# Patient Record
Sex: Male | Born: 1965 | Race: White | Hispanic: No | Marital: Married | Smoking: Current every day smoker
Health system: Southern US, Community
[De-identification: ages and names within clinical notes are randomized; demographics above are authoritative.]

## PROBLEM LIST (undated history)

## (undated) DIAGNOSIS — G43909 Migraine, unspecified, not intractable, without status migrainosus: Secondary | ICD-10-CM

---

## 2005-04-25 ENCOUNTER — Emergency Department: Payer: Self-pay | Admitting: Unknown Physician Specialty

## 2006-04-10 ENCOUNTER — Emergency Department: Payer: Self-pay | Admitting: Emergency Medicine

## 2006-04-10 ENCOUNTER — Other Ambulatory Visit: Payer: Self-pay

## 2006-12-11 ENCOUNTER — Emergency Department: Payer: Self-pay | Admitting: Emergency Medicine

## 2007-06-20 ENCOUNTER — Emergency Department: Payer: Self-pay | Admitting: Emergency Medicine

## 2007-06-20 ENCOUNTER — Other Ambulatory Visit: Payer: Self-pay

## 2009-06-16 ENCOUNTER — Emergency Department: Payer: Self-pay | Admitting: Emergency Medicine

## 2011-04-17 ENCOUNTER — Emergency Department: Payer: Self-pay | Admitting: Emergency Medicine

## 2011-04-17 LAB — CBC
HGB: 15.8 g/dL (ref 13.0–18.0)
Platelet: 181 10*3/uL (ref 150–440)
RDW: 13.2 % (ref 11.5–14.5)
WBC: 11.2 10*3/uL — ABNORMAL HIGH (ref 3.8–10.6)

## 2011-04-17 LAB — BASIC METABOLIC PANEL
Calcium, Total: 9.2 mg/dL (ref 8.5–10.1)
Chloride: 99 mmol/L (ref 98–107)
Co2: 26 mmol/L (ref 21–32)
Creatinine: 1.05 mg/dL (ref 0.60–1.30)
EGFR (African American): >60
Glucose: 98 mg/dL (ref 65–99)
Potassium: 3.6 mmol/L (ref 3.5–5.1)
Sodium: 137 mmol/L (ref 136–145)

## 2011-04-17 LAB — TROPONIN I: Troponin-I: <0.02 ng/mL

## 2011-10-07 ENCOUNTER — Emergency Department: Payer: Self-pay | Admitting: *Deleted

## 2012-02-18 ENCOUNTER — Emergency Department: Payer: Self-pay | Admitting: Emergency Medicine

## 2012-02-18 LAB — COMPREHENSIVE METABOLIC PANEL
Albumin: 3.9 g/dL (ref 3.4–5.0)
Alkaline Phosphatase: 98 U/L (ref 50–136)
BUN: 15 mg/dL (ref 7–18)
Creatinine: 0.86 mg/dL (ref 0.60–1.30)
EGFR (African American): >60
Glucose: 89 mg/dL (ref 65–99)
Potassium: 4.3 mmol/L (ref 3.5–5.1)
SGOT(AST): 23 U/L (ref 15–37)
SGPT (ALT): 24 U/L (ref 12–78)
Total Protein: 7.5 g/dL (ref 6.4–8.2)

## 2012-02-18 LAB — CBC
HCT: 42.1 % (ref 40.0–52.0)
HGB: 14.7 g/dL (ref 13.0–18.0)
MCHC: 35 g/dL (ref 32.0–36.0)
MCV: 94 fL (ref 80–100)
RBC: 4.47 10*6/uL (ref 4.40–5.90)
WBC: 11.1 10*3/uL — ABNORMAL HIGH (ref 3.8–10.6)

## 2012-02-18 LAB — TROPONIN I: Troponin-I: <0.02 ng/mL

## 2012-02-18 LAB — URINALYSIS, COMPLETE
Bilirubin,UR: NEGATIVE
Blood: NEGATIVE
Hyaline Cast: 1
Ketone: NEGATIVE
Nitrite: NEGATIVE
Ph: 5 (ref 4.5–8.0)
Protein: NEGATIVE
Specific Gravity: 1.021 (ref 1.003–1.030)
WBC UR: 1 /HPF (ref 0–5)

## 2012-11-15 ENCOUNTER — Emergency Department: Payer: Self-pay | Admitting: Emergency Medicine

## 2012-12-23 ENCOUNTER — Emergency Department: Payer: Self-pay | Admitting: Emergency Medicine

## 2013-11-07 ENCOUNTER — Emergency Department: Payer: Self-pay | Admitting: Emergency Medicine

## 2013-11-25 ENCOUNTER — Emergency Department: Payer: Self-pay | Admitting: Internal Medicine

## 2013-11-25 LAB — TROPONIN I

## 2013-11-25 LAB — BASIC METABOLIC PANEL
Anion Gap: 4 — ABNORMAL LOW (ref 7–16)
BUN: 13 mg/dL (ref 7–18)
CALCIUM: 8.9 mg/dL (ref 8.5–10.1)
Chloride: 101 mmol/L (ref 98–107)
Co2: 27 mmol/L (ref 21–32)
Creatinine: 1.12 mg/dL (ref 0.60–1.30)
EGFR (African American): >60
GLUCOSE: 82 mg/dL (ref 65–99)
OSMOLALITY: 264 (ref 275–301)
POTASSIUM: 3.9 mmol/L (ref 3.5–5.1)
Sodium: 132 mmol/L — ABNORMAL LOW (ref 136–145)

## 2013-11-25 LAB — CBC
HCT: 38.9 % — ABNORMAL LOW (ref 40.0–52.0)
HGB: 13.3 g/dL (ref 13.0–18.0)
MCH: 32.1 pg (ref 26.0–34.0)
MCHC: 34.3 g/dL (ref 32.0–36.0)
MCV: 94 fL (ref 80–100)
PLATELETS: 233 10*3/uL (ref 150–440)
RBC: 4.16 10*6/uL — ABNORMAL LOW (ref 4.40–5.90)
RDW: 12.8 % (ref 11.5–14.5)
WBC: 13.4 10*3/uL — ABNORMAL HIGH (ref 3.8–10.6)

## 2013-11-25 LAB — PRO B NATRIURETIC PEPTIDE: B-Type Natriuretic Peptide: 142 pg/mL — ABNORMAL HIGH (ref 0–125)

## 2017-12-21 ENCOUNTER — Emergency Department
Admission: EM | Admit: 2017-12-21 | Discharge: 2017-12-21 | Disposition: A | Discharge status: Home / Self Care | Payer: Self-pay | Attending: Emergency Medicine | Admitting: Emergency Medicine

## 2017-12-21 ENCOUNTER — Other Ambulatory Visit: Payer: Self-pay

## 2017-12-21 ENCOUNTER — Emergency Department: Payer: Self-pay

## 2017-12-21 DIAGNOSIS — F17200 Nicotine dependence, unspecified, uncomplicated: Secondary | ICD-10-CM | POA: Insufficient documentation

## 2017-12-21 DIAGNOSIS — M25561 Pain in right knee: Secondary | ICD-10-CM | POA: Insufficient documentation

## 2017-12-21 HISTORY — DX: Migraine, unspecified, not intractable, without status migrainosus: G43.909

## 2017-12-21 MED ORDER — NABUMETONE 500 MG PO TABS: 500.0000 mg | ORAL_TABLET | Freq: Every day | ORAL | 0 refills | Status: AC

## 2017-12-21 NOTE — ED Provider Notes (Signed)
[facility] Emergency Department Provider Note  ____________________________________________   First MD Initiated Contact with Patient 12/21/17 1108     (approximate)  I have reviewed the triage vital signs and the nursing notes.   HISTORY  Chief Complaint Knee Pain   HPI Douglas Estrada is a 52 y.o. male presents to the ED with complaint of knee pain for approximately 3 days.  Patient states that he feels that his knee is not supportive when he walks.  He has had problems with his knee in the past with same knee.  He is not been seen by an orthopedist in the past.  He has been taking over-the-counter medication infrequently for his knee.  He also currently is wearing a limited knee support.  He denies any recent injury to his knee.  Currently rates his pain as 5/10.   Past Medical History:  Diagnosis Date  . Migraines     There are no active problems to display for this patient.   History reviewed. No pertinent surgical history.  Prior to Admission medications   Medication Sig Start Date End Date Taking? Authorizing Provider  nabumetone (RELAFEN) 500 MG tablet Take 1 tablet (500 mg total) by mouth daily. 12/21/17   Tommi Rumps, PA-C    Allergies Patient has no known allergies.  No family history on file.  Social History Social History   Tobacco Use  . Smoking status: Current Every Day Smoker  Substance Use Topics  . Alcohol use: Yes    Alcohol/week: 2.0 standard drinks    Types: 2 Cans of beer per week  . Drug use: Not on file    Review of Systems Constitutional: No fever/chills Cardiovascular: Denies chest pain. Respiratory: Denies shortness of breath. Musculoskeletal: Positive for right knee pain. Skin: Negative for rash. Neurological: Negative for  focal weakness or numbness. ___________________________________________   PHYSICAL EXAM:  VITAL SIGNS: ED Triage Vitals [12/21/17 1050]  Enc Vitals Group     BP 111/72     Pulse Rate 83     Resp 18     Temp 98.3 F (36.8 C)     Temp Source Oral     SpO2 100 %     Weight 154 lb (69.9 kg)     Height 6\' 4"  (1.93 m)     Head Circumference      Peak Flow      Pain Score 5     Pain Loc      Pain Edu?      Excl. in GC?    Constitutional: Alert and oriented. Well appearing and in no acute distress. Head: Atraumatic. Neck: No stridor.   Cardiovascular: Normal rate, regular rhythm. Grossly normal heart sounds.  Good peripheral circulation. Respiratory: Normal respiratory effort.  No retractions. Lungs CTAB. Musculoskeletal: Examination of the right knee there is no gross deformity and no effusion is present.  No soft tissue swelling or discoloration is appreciated.  Patient range of motion is limited secondary to discomfort and moderate crepitus is present.  Ligaments are stable bilaterally.  No erythema, ecchymosis or abrasions were seen.  Nontender muscles to palpation and Homans sign is negative.  Good muscle strength bilaterally. Neurologic:  Normal speech and language. No gross focal neurologic deficits are appreciated.  Skin:  Skin is warm, dry and intact. No rash noted. Psychiatric: Mood and affect are normal. Speech and behavior are normal.  ____________________________________________   LABS (all labs ordered are listed, but only abnormal results  are displayed)  Labs Reviewed - No data to display  RADIOLOGY  ED MD interpretation:  Right knee x-ray shows some mild degenerative changes but no acute bony injury.  Official radiology report(s): Dg Knee Complete 4 Views Right  Result Date: 12/21/2017 CLINICAL DATA:  Generalized anterior right knee pain for 3 days. No specific injury. Unable to bear weight. Initial encounter. EXAM: RIGHT KNEE - COMPLETE 4+ VIEW COMPARISON:  None. FINDINGS: No evidence of fracture, dislocation, or joint effusion. No evidence of arthropathy or other focal bone abnormality. Soft tissues are unremarkable. IMPRESSION:  Negative. Electronically Signed   By: Sebastian Ache M.D.   On: 12/21/2017 12:13    ____________________________________________   PROCEDURES  Procedure(s) performed: Knee immobilizer was placed by Carmon Sails, RN.  Procedures  Critical Care performed: No  ____________________________________________   INITIAL IMPRESSION / ASSESSMENT AND PLAN / ED COURSE  As part of my medical decision making, I reviewed the following data within the electronic MEDICAL RECORD NUMBER Notes from prior ED visits and [place] Controlled Substance Database  52 year old male presents with complaint of right knee pain without a history of injury.  Physical exam is positive for degenerative changes.  X-ray does not show any acute bony changes.  Patient was placed in a knee immobilizer and given a prescription for Relafen 500 mg twice daily with food.  He is to follow-up with Dr. Rosita Kea if any continued problems with his knee.  He was also instructed to use the knee immobilizer when he is up walking for the next 1 to 2 weeks.  ____________________________________________   FINAL CLINICAL IMPRESSION(S) / ED DIAGNOSES  Final diagnoses:  Acute pain of right knee     ED Discharge Orders         Ordered    nabumetone (RELAFEN) 500 MG tablet  Daily     12/21/17 1236           Note:  This document was prepared using Dragon voice recognition software and may include unintentional dictation errors.    Tommi Rumps, PA-C 12/21/17 1338    Rockne Menghini, MD 12/21/17 [phone]

## 2017-12-21 NOTE — ED Notes (Signed)
See triage note Presents with right knee pain for the past 3 days  No injury

## 2017-12-21 NOTE — ED Triage Notes (Signed)
Right knee pain X 3 days. States that it is not supportive to walk. Hx of knee pain to same knee. No injury. Pt alert and oriented X4, active, cooperative, pt in NAD. RR even and unlabored, color WNL.

## 2017-12-21 NOTE — Discharge Instructions (Signed)
Call make an appointment with Dr. Rosita Kea if not improving with your knee pain.  Begin taking Relafen 500 mg twice daily with food.  Wear knee immobilizer when up walking for the next 1 to 2 weeks as needed.  You do not have to sleep in this brace.  Ice and elevation as needed for knee pain.

## 2020-05-27 IMAGING — DX DG KNEE COMPLETE 4+V*R*
4 series · 4 of 4 positions shown · non-contrast
Comparison: None.

CLINICAL DATA: Generalized anterior right knee pain for 3 days. No
specific injury. Unable to bear weight. Initial encounter.

EXAM:
RIGHT KNEE - COMPLETE 4+ VIEW

[knee ap]
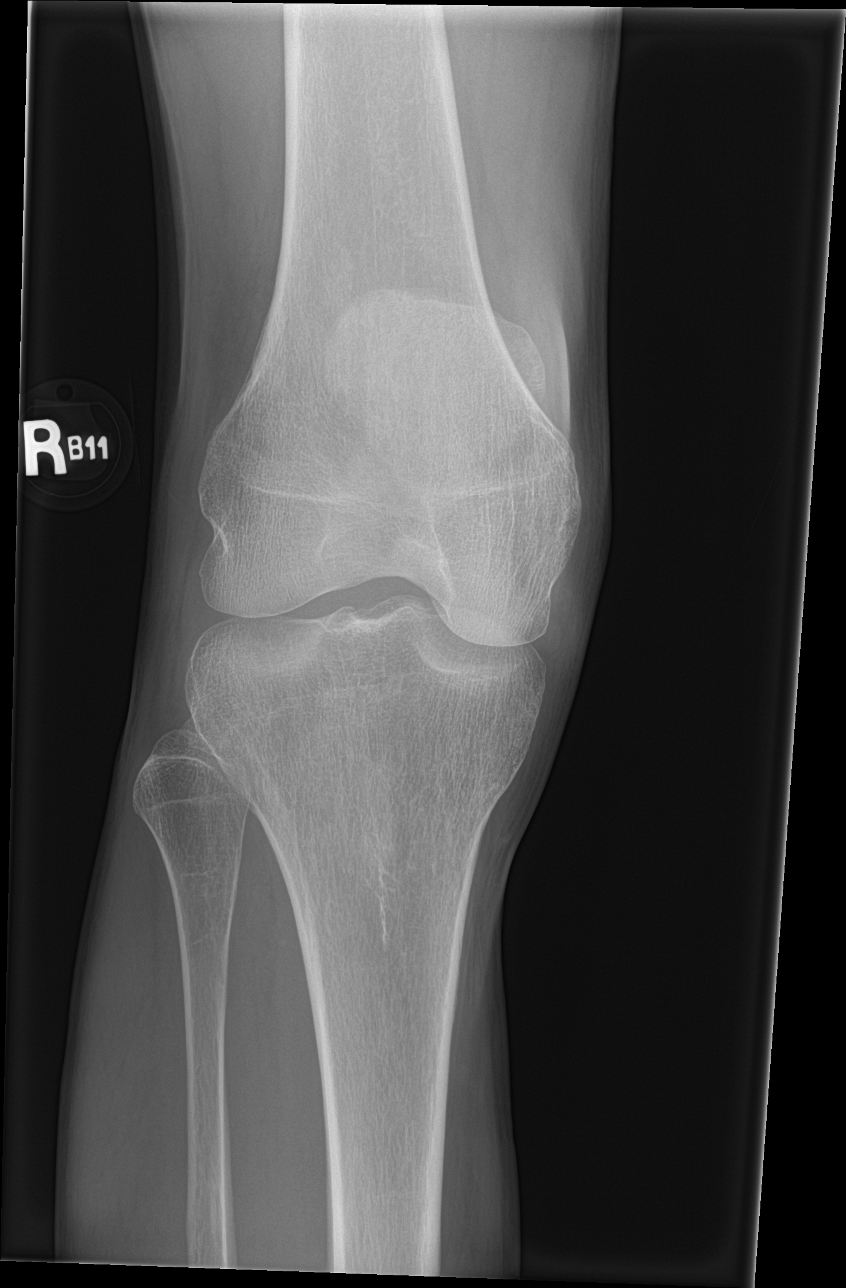

[knee lat]
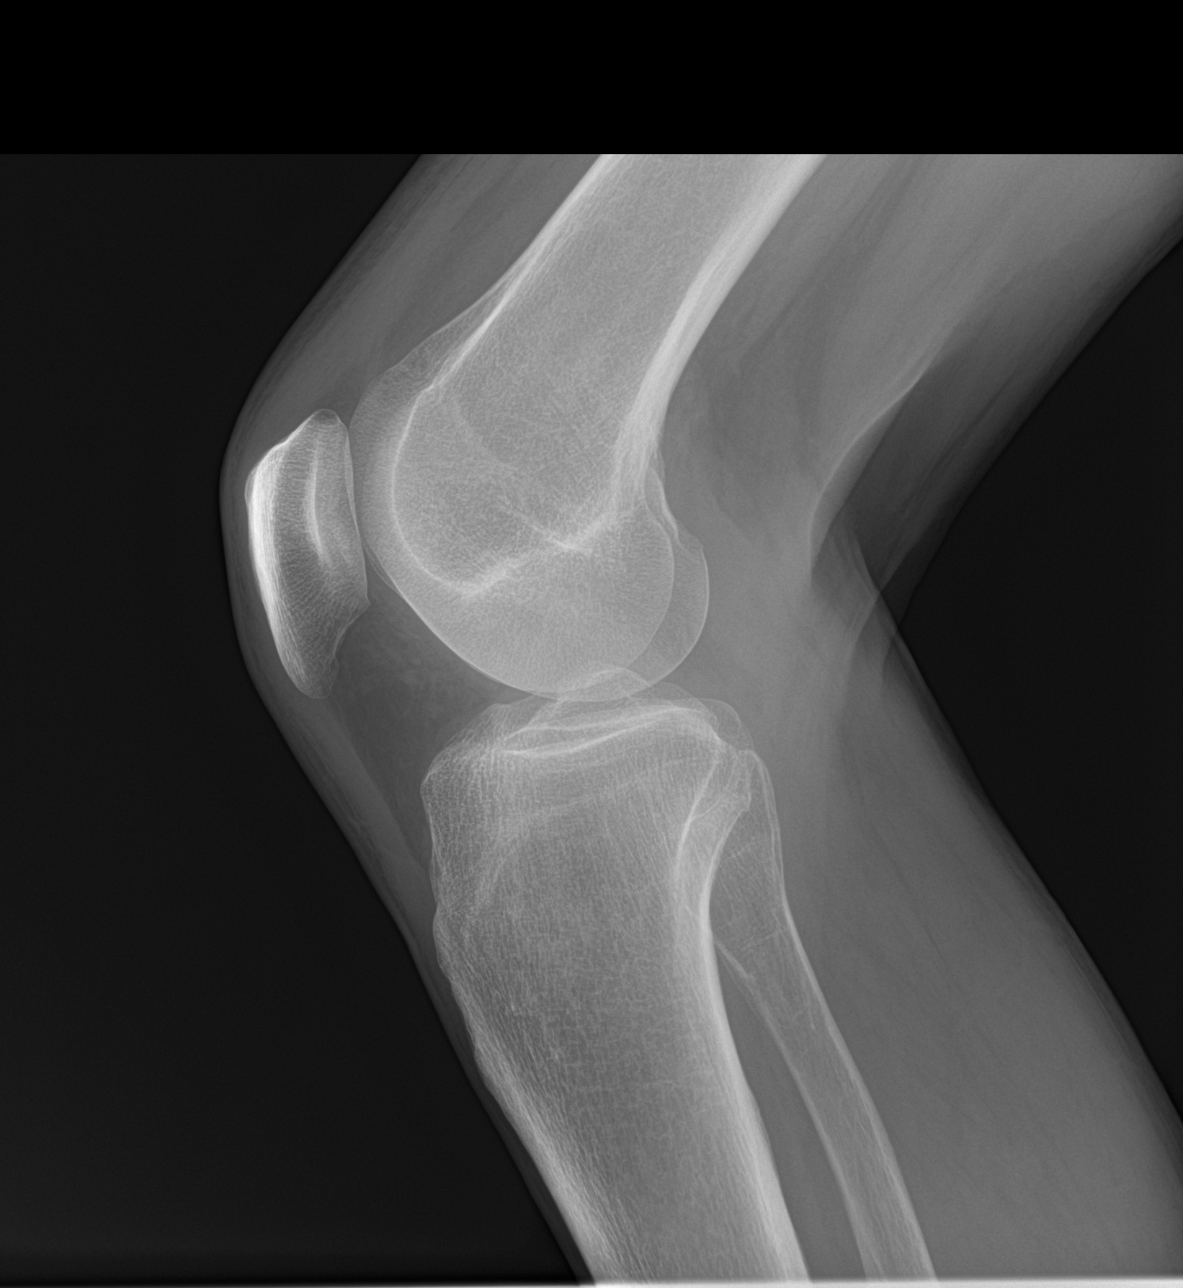

[knee obl (1 of 2)]
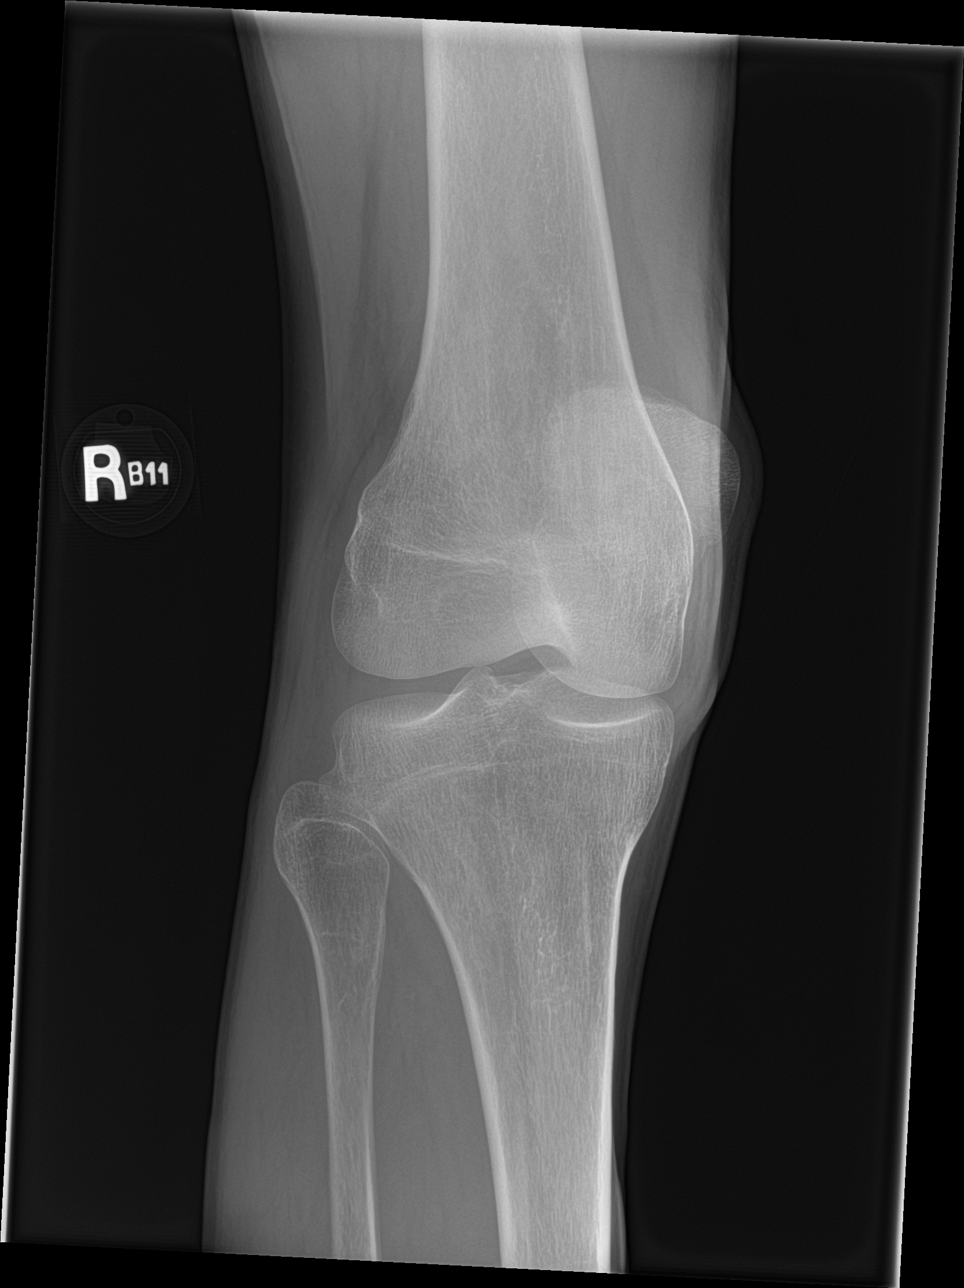

[knee obl (2 of 2)]
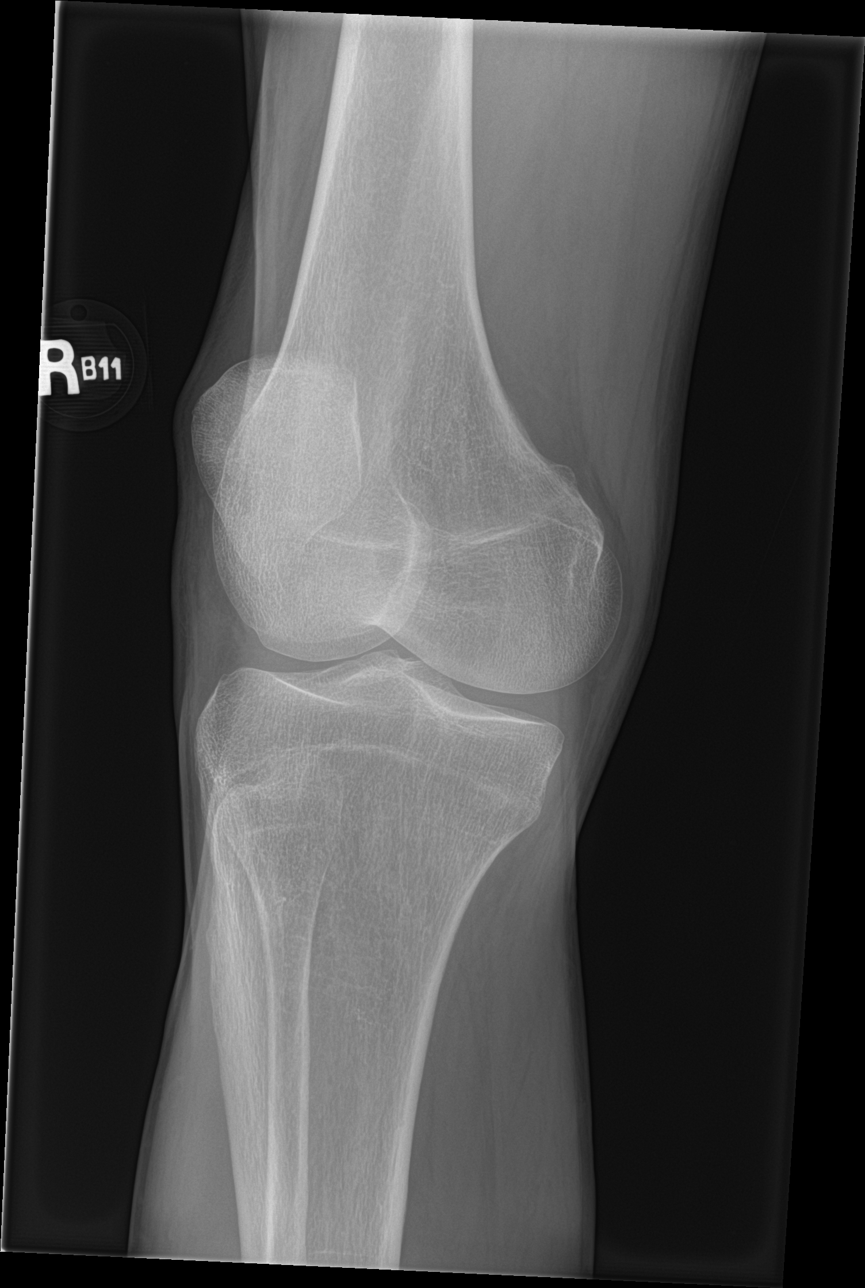

[4 of 4 positions shown; findings below may reference images not displayed]

FINDINGS: No evidence of fracture, dislocation, or joint effusion. No evidence
of arthropathy or other focal bone abnormality. Soft tissues are
unremarkable.
IMPRESSION: Negative.

## 2022-09-11 ENCOUNTER — Emergency Department
Admission: EM | Admit: 2022-09-11 | Discharge: 2022-09-11 | Disposition: A | Discharge status: Home / Self Care | Payer: 59 | Attending: Emergency Medicine | Admitting: Emergency Medicine

## 2022-09-11 ENCOUNTER — Emergency Department: Payer: 59

## 2022-09-11 ENCOUNTER — Other Ambulatory Visit: Payer: Self-pay

## 2022-09-11 DIAGNOSIS — F17200 Nicotine dependence, unspecified, uncomplicated: Secondary | ICD-10-CM | POA: Diagnosis not present

## 2022-09-11 DIAGNOSIS — R079 Chest pain, unspecified: Secondary | ICD-10-CM | POA: Diagnosis present

## 2022-09-11 LAB — TROPONIN I (HIGH SENSITIVITY)
Troponin I (High Sensitivity): 5 ng/L (ref <18)
Troponin I (High Sensitivity): 6 ng/L (ref <18)

## 2022-09-11 LAB — CBC
HCT: 37.8 % — ABNORMAL LOW (ref 39.0–52.0)
Hemoglobin: 13.4 g/dL (ref 13.0–17.0)
MCH: 32.3 pg (ref 26.0–34.0)
MCHC: 35.4 g/dL (ref 30.0–36.0)
MCV: 91.1 fL (ref 80.0–100.0)
Platelets: 208 10*3/uL (ref 150–400)
RBC: 4.15 MIL/uL — ABNORMAL LOW (ref 4.22–5.81)
RDW: 13.2 % (ref 11.5–15.5)
WBC: 8 10*3/uL (ref 4.0–10.5)
nRBC: 0 % (ref 0.0–0.2)

## 2022-09-11 LAB — HEPATIC FUNCTION PANEL
ALT: 48 U/L — ABNORMAL HIGH (ref 0–44)
AST: 53 U/L — ABNORMAL HIGH (ref 15–41)
Albumin: 4 g/dL (ref 3.5–5.0)
Alkaline Phosphatase: 62 U/L (ref 38–126)
Bilirubin, Direct: <0.1 mg/dL (ref 0.0–0.2)
Total Bilirubin: 0.4 mg/dL (ref 0.3–1.2)
Total Protein: 7.2 g/dL (ref 6.5–8.1)

## 2022-09-11 LAB — BASIC METABOLIC PANEL
Anion gap: 11 (ref 5–15)
BUN: 10 mg/dL (ref 6–20)
CO2: 21 mmol/L — ABNORMAL LOW (ref 22–32)
Calcium: 8.5 mg/dL — ABNORMAL LOW (ref 8.9–10.3)
Chloride: 101 mmol/L (ref 98–111)
Creatinine, Ser: 0.79 mg/dL (ref 0.61–1.24)
GFR, Estimated: >60 mL/min (ref >60)
Glucose, Bld: 120 mg/dL — ABNORMAL HIGH (ref 70–99)
Potassium: 3.4 mmol/L — ABNORMAL LOW (ref 3.5–5.1)
Sodium: 133 mmol/L — ABNORMAL LOW (ref 135–145)

## 2022-09-11 LAB — LIPASE, BLOOD: Lipase: 40 U/L (ref 11–51)

## 2022-09-11 MED ORDER — METOCLOPRAMIDE HCL 10 MG PO TABS: 10.0000 mg | ORAL_TABLET | Freq: Four times a day (QID) | ORAL | 0 refills | Status: AC | PRN

## 2022-09-11 MED ORDER — SODIUM CHLORIDE 0.9 % IV BOLUS
1000.0000 mL | Freq: Once | INTRAVENOUS | Status: AC
  Administered 2022-09-11: 1000 mL via INTRAVENOUS

## 2022-09-11 MED ORDER — IOHEXOL 350 MG/ML SOLN
75.0000 mL | Freq: Once | INTRAVENOUS | Status: AC | PRN
  Administered 2022-09-11: 75 mL via INTRAVENOUS

## 2022-09-11 MED ORDER — FAMOTIDINE 20 MG PO TABS: 20.0000 mg | ORAL_TABLET | Freq: Two times a day (BID) | ORAL | 0 refills | Status: AC

## 2022-09-11 MED ORDER — SUCRALFATE 1 G PO TABS: 1.0000 g | ORAL_TABLET | Freq: Four times a day (QID) | ORAL | 1 refills | Status: AC

## 2022-09-11 MED ORDER — PANTOPRAZOLE SODIUM 40 MG IV SOLR
40.0000 mg | Freq: Once | INTRAVENOUS | Status: AC
  Administered 2022-09-11: 40 mg via INTRAVENOUS
  Filled 2022-09-11: qty 10

## 2022-09-11 MED ORDER — FAMOTIDINE 20 MG PO TABS
40.0000 mg | ORAL_TABLET | Freq: Once | ORAL | Status: AC
  Administered 2022-09-11: 40 mg via ORAL
  Filled 2022-09-11: qty 2

## 2022-09-11 MED ORDER — KETOROLAC TROMETHAMINE 15 MG/ML IJ SOLN
15.0000 mg | Freq: Once | INTRAMUSCULAR | Status: AC
  Administered 2022-09-11: 15 mg via INTRAVENOUS
  Filled 2022-09-11: qty 1

## 2022-09-11 MED ORDER — SUCRALFATE 1 G PO TABS
1.0000 g | ORAL_TABLET | Freq: Once | ORAL | Status: AC
  Administered 2022-09-11: 1 g via ORAL
  Filled 2022-09-11: qty 1

## 2022-09-11 MED ORDER — METOCLOPRAMIDE HCL 10 MG PO TABS
10.0000 mg | ORAL_TABLET | Freq: Once | ORAL | Status: AC
  Administered 2022-09-11: 10 mg via ORAL
  Filled 2022-09-11: qty 1

## 2022-09-11 NOTE — ED Triage Notes (Signed)
Pt to ed from home via [facility] for CP x 1 day. [facility] in center. Pt is clenching his chest in triage.  NS on ems 12 lead 20 RFA 324 ASA 0.4 NTG Pt is caox4, in no acute distress and in wheel chair in triage,

## 2022-09-11 NOTE — ED Provider Notes (Signed)
[facility] Provider Note    Event Date/Time   First MD Initiated Contact with Patient 09/11/22 0241     (approximate)   History   Chief Complaint: Chest Pain   HPI  Douglas Estrada is a 57 y.o. male who complains of central chest pain for the past 1 day, feels sharp, worse with breathing.  Nonradiating.  No shortness of breath diaphoresis or vomiting.  Not exertional.  Constant.  Reports daily smoking and drinking beer.     Physical Exam   Triage Vital Signs: ED Triage Vitals [09/11/22 0014]  Encounter Vitals Group     BP 125/86     Systolic BP Percentile      Diastolic BP Percentile      Pulse Rate 98     Resp 16     Temp 98 F (36.7 C)     Temp Source Oral     SpO2 98 %     Weight      Height 6\' 4"  (1.93 m)     Head Circumference      Peak Flow      Pain Score 8     Pain Loc      Pain Education      Exclude from Growth Chart     Most recent vital signs: Vitals:   09/11/22 0014 09/11/22 0305  BP: 125/86 (!) 139/94  Pulse: 98 (!) 59  Resp: 16 (!) 21  Temp: 98 F (36.7 C)   SpO2: 98% 98%    General: Awake, no distress.  Gaunt appearance CV:  Good peripheral perfusion.  Regular rate and rhythm Resp:  Normal effort.  Clear to auscultation bilaterally Abd:  No distention.  Soft, nontender Other:  Moist oral mucosa, no lower extremity edema   ED Results / Procedures / Treatments   Labs (all labs ordered are listed, but only abnormal results are displayed) Labs Reviewed  BASIC METABOLIC PANEL - Abnormal; Notable for the following components:      Result Value   Sodium 133 (*)    Potassium 3.4 (*)    CO2 21 (*)    Glucose, Bld 120 (*)    Calcium 8.5 (*)    All other components within normal limits  CBC - Abnormal; Notable for the following components:   RBC 4.15 (*)    HCT 37.8 (*)    All other components within normal limits  HEPATIC FUNCTION PANEL - Abnormal; Notable for the following components:   AST 53 (*)    ALT  48 (*)    All other components within normal limits  LIPASE, BLOOD  TROPONIN I (HIGH SENSITIVITY)  TROPONIN I (HIGH SENSITIVITY)     EKG Interpreted by me Normal sinus rhythm rate of 99.  Normal axis intervals QRS ST segments and T waves.   RADIOLOGY CT chest interpreted by me, appears normal without PE or airspace disease.  No mass.  Radiology report reviewed   PROCEDURES:  Procedures   MEDICATIONS ORDERED IN ED: Medications  sucralfate (CARAFATE) tablet 1 g (has no administration in time range)  metoCLOPramide (REGLAN) tablet 10 mg (has no administration in time range)  famotidine (PEPCID) tablet 40 mg (has no administration in time range)  pantoprazole (PROTONIX) injection 40 mg (40 mg Intravenous Given 09/11/22 0306)  ketorolac (TORADOL) 15 MG/ML injection 15 mg (15 mg Intravenous Given 09/11/22 0306)  sodium chloride 0.9 % bolus 1,000 mL (1,000 mLs Intravenous New Bag/Given 09/11/22 0306)  iohexol (  OMNIPAQUE) 350 MG/ML injection 75 mL (75 mLs Intravenous Contrast Given 09/11/22 0300)     IMPRESSION / MDM / ASSESSMENT AND PLAN / ED COURSE  I reviewed the triage vital signs and the nursing notes.  DDx: GERD, pneumothorax, non-STEMI, pulmonary embolism, lung mass, electrolyte abnormality, alcoholic gastritis  Patient's presentation is most consistent with acute presentation with potential threat to life or bodily function.  Patient presents with atypical chest pain.  It is pleuritic and sharp, and patient is long-term smoker with no regular medical care who has an underweight appearance which could possibly be due to cancer.  Labs unremarkable.  CT scan obtained which is negative for PE or other acute findings.  Will treat with antacids.  Stable for discharge.       FINAL CLINICAL IMPRESSION(S) / ED DIAGNOSES   Final diagnoses:  Nonspecific chest pain     Rx / DC Orders   ED Discharge Orders          Ordered    Ambulatory Referral to Primary Care (Establish  Care)        09/11/22 0400    sucralfate (CARAFATE) 1 g tablet  4 times daily        09/11/22 0401    famotidine (PEPCID) 20 MG tablet  2 times daily        09/11/22 0401    metoCLOPramide (REGLAN) 10 MG tablet  Every 6 hours PRN        09/11/22 0401             Note:  This document was prepared using Dragon voice recognition software and may include unintentional dictation errors.   Sharman Cheek, MD 09/11/22 [phone]

## 2023-05-03 ENCOUNTER — Other Ambulatory Visit: Payer: Self-pay

## 2023-05-03 ENCOUNTER — Emergency Department
Admission: EM | Admit: 2023-05-03 | Discharge: 2023-05-03 | Disposition: A | Discharge status: Home / Self Care | Attending: Emergency Medicine | Admitting: Emergency Medicine

## 2023-05-03 DIAGNOSIS — K625 Hemorrhage of anus and rectum: Secondary | ICD-10-CM | POA: Diagnosis present

## 2023-05-03 DIAGNOSIS — K648 Other hemorrhoids: Secondary | ICD-10-CM | POA: Diagnosis not present

## 2023-05-03 DIAGNOSIS — K649 Unspecified hemorrhoids: Secondary | ICD-10-CM

## 2023-05-03 LAB — COMPREHENSIVE METABOLIC PANEL
ALT: 19 U/L (ref 0–44)
AST: 27 U/L (ref 15–41)
Albumin: 4.1 g/dL (ref 3.5–5.0)
Alkaline Phosphatase: 58 U/L (ref 38–126)
Anion gap: 12 (ref 5–15)
BUN: 11 mg/dL (ref 6–20)
CO2: 24 mmol/L (ref 22–32)
Calcium: 9.1 mg/dL (ref 8.9–10.3)
Chloride: 99 mmol/L (ref 98–111)
Creatinine, Ser: 0.95 mg/dL (ref 0.61–1.24)
GFR, Estimated: >60 mL/min (ref >60)
Glucose, Bld: 111 mg/dL — ABNORMAL HIGH (ref 70–99)
Potassium: 4.1 mmol/L (ref 3.5–5.1)
Sodium: 135 mmol/L (ref 135–145)
Total Bilirubin: 0.8 mg/dL (ref 0.0–1.2)
Total Protein: 7.9 g/dL (ref 6.5–8.1)

## 2023-05-03 LAB — CBC WITH DIFFERENTIAL/PLATELET
Abs Immature Granulocytes: 0.02 10*3/uL (ref 0.00–0.07)
Basophils Absolute: 0.1 10*3/uL (ref 0.0–0.1)
Basophils Relative: 1 %
Eosinophils Absolute: 0.7 10*3/uL — ABNORMAL HIGH (ref 0.0–0.5)
Eosinophils Relative: 8 %
HCT: 45.6 % (ref 39.0–52.0)
Hemoglobin: 15.6 g/dL (ref 13.0–17.0)
Immature Granulocytes: 0 %
Lymphocytes Relative: 28 %
Lymphs Abs: 2.5 10*3/uL (ref 0.7–4.0)
MCH: 33 pg (ref 26.0–34.0)
MCHC: 34.2 g/dL (ref 30.0–36.0)
MCV: 96.4 fL (ref 80.0–100.0)
Monocytes Absolute: 0.8 10*3/uL (ref 0.1–1.0)
Monocytes Relative: 9 %
Neutro Abs: 4.8 10*3/uL (ref 1.7–7.7)
Neutrophils Relative %: 54 %
Platelets: 262 10*3/uL (ref 150–400)
RBC: 4.73 MIL/uL (ref 4.22–5.81)
RDW: 12.9 % (ref 11.5–15.5)
WBC: 9 10*3/uL (ref 4.0–10.5)
nRBC: 0 % (ref 0.0–0.2)

## 2023-05-03 MED ORDER — LIDOCAINE VISCOUS HCL 2 % MT SOLN
15.0000 mL | Freq: Once | OROMUCOSAL | Status: AC
  Administered 2023-05-03: 15 mL via OROMUCOSAL
  Filled 2023-05-03: qty 15

## 2023-05-03 MED ORDER — HYDROCORTISONE ACETATE 25 MG RE SUPP: 25.0000 mg | Freq: Two times a day (BID) | RECTAL | 0 refills | Status: AC

## 2023-05-03 MED ORDER — DIBUCAINE (PERIANAL) 1 % EX OINT: 1.0000 | TOPICAL_OINTMENT | Freq: Three times a day (TID) | CUTANEOUS | 0 refills | Status: AC | PRN

## 2023-05-03 NOTE — ED Triage Notes (Signed)
 Patient states he has a mass around his rectum that has been bleeding for a year.

## 2023-05-03 NOTE — Discharge Instructions (Signed)
 Your exam is consistent with internal hemorrhoid.  Use the suppository and external cream as directed.  You should increase your fiber intake.  Consider using MiraLAX once to twice daily and your foods/drinks, Promote soft stools.  Follow-up with GI medicine to establish care, and schedule your screening colonoscopy.  Please go to the following website to schedule new (and existing) patient appointments:   [email or web address]   The following is a list of primary care offices in the area who are accepting new patients at this time.  Please reach out to one of them directly and let them know you would like to schedule an appointment to follow up on an Emergency Department visit, and/or to establish a new primary care provider (PCP).  There are likely other primary care clinics in the are who are accepting new patients, but this is an excellent place to start:  [facility] Lead physician: Dr Shirlee Latch [address] [place], [place] [address] [phone]  [facility] Lead Physician: Dr Alba Cory [address], [place], [place] [address] [phone]  [facility]  Lead Physician: Dr Olevia Perches [address] [place], [place] [address] [phone]  [facility] Lead Physician: Dr Sofie Hartigan [address] [place], [place] [address] [phone]  [facility] at [facility] Lead Physician: Dr Bari Edward [address] [place], [place], [place] [address] [phone]

## 2023-05-03 NOTE — ED Provider Notes (Signed)
 [facility] Emergency Department Provider Note     Event Date/Time   First MD Initiated Contact with Patient 05/03/23 1508     (approximate)   History   Rectal Bleeding  HPI  Douglas Estrada is a 58 y.o. male with a noncontributory medical history, presents to the ED noting tenderness and a mass to his rectum.  He notes that it has been present for the last year, with intermittent episodes of bleeding and tenderness.  He does give a history of hemorrhoids, but is unclear if this represents a hemorrhoid.  He would report intermittent bowel changes including firm hard stools, and small pellets past at other times.  He denies any pencil thin stools, clay colored stools, or melanotic stools.  He would endorse some bright red blood per toilet tissue after some stool passage intermittently.  Patient has not had a screening colonoscopy to this point in his life.  Fevers, chills, abdominal pain, or unexpected weight loss reported.  Physical Exam   Triage Vital Signs: ED Triage Vitals  Encounter Vitals Group     BP 05/03/23 1211 (!) 140/92     Systolic BP Percentile --      Diastolic BP Percentile --      Pulse Rate 05/03/23 1211 86     Resp 05/03/23 1211 20     Temp 05/03/23 1211 98.3 F (36.8 C)     Temp Source 05/03/23 1211 Oral     SpO2 05/03/23 1211 94 %     Weight 05/03/23 1210 150 lb (68 kg)     Height 05/03/23 1210 6\' 4"  (1.93 m)     Head Circumference --      Peak Flow --      Pain Score 05/03/23 1210 5     Pain Loc --      Pain Education --      Exclude from Growth Chart --     Most recent vital signs: Vitals:   05/03/23 1211  BP: (!) 140/92  Pulse: 86  Resp: 20  Temp: 98.3 F (36.8 C)  SpO2: 94%    General Awake, no distress. NAD HEENT NCAT. PERRL. EOMI. No rhinorrhea. Mucous membranes are moist.  CV:  Good peripheral perfusion.  RESP:  Normal effort.  ABD:  No distention.  GU:  External rectal exam reveals what appears to be  internal hemorrhoid mucosal tissue to the left portion of the rectum.  The pink, moist tissue is tender to palpation.  Normal rectal tone is appreciated.   ED Results / Procedures / Treatments   Labs (all labs ordered are listed, but only abnormal results are displayed) Labs Reviewed  COMPREHENSIVE METABOLIC PANEL - Abnormal; Notable for the following components:      Result Value   Glucose, Bld 111 (*)    All other components within normal limits  CBC WITH DIFFERENTIAL/PLATELET - Abnormal; Notable for the following components:   Eosinophils Absolute 0.7 (*)    All other components within normal limits    EKG   RADIOLOGY   No results found.  PROCEDURES:  Critical Care performed: No  Procedures   MEDICATIONS ORDERED IN ED: Medications  lidocaine (XYLOCAINE) 2 % viscous mouth solution 15 mL (15 mLs Mouth/Throat Given 05/03/23 1646)     IMPRESSION / MDM / ASSESSMENT AND PLAN / ED COURSE  I reviewed the triage vital signs and the nursing notes.  Differential diagnosis includes, but is not limited to, rectal prolapse, internal hemorrhoid, external hemorrhoid, fissure  Patient's presentation is most consistent with acute presentation with potential threat to life or bodily function.  Patient's diagnosis is consistent with external hemorrhoids with intermittent bleeding hemorrhoids. Patient will be discharged home with prescriptions for Anusol suppositories and to became. Patient is to follow up with GI medicine as discussed, as needed or otherwise directed.  Patient is also encouraged to increase his fiber intake and consider daily MiraLAX dosing.  He is also advised and encouraged to select a primary care provider for medical home.  Patient is given ED precautions to return to the ED for any worsening or new symptoms.  FINAL CLINICAL IMPRESSION(S) / ED DIAGNOSES   Final diagnoses:  Hemorrhoids, unspecified hemorrhoid type  Rectal bleeding      Rx / DC Orders   ED Discharge Orders          Ordered    dibucaine (NUPERCAINAL) 1 % OINT  3 times daily PRN        05/03/23 1615    hydrocortisone (ANUSOL-HC) 25 MG suppository  Every 12 hours        05/03/23 1615             Note:  This document was prepared using Dragon voice recognition software and may include unintentional dictation errors. Karmen Stabs, Charlesetta Ivory, PA-C 05/03/23 1651    Trinna Post, MD 05/03/23 [phone]

## 2024-01-08 ENCOUNTER — Encounter: Payer: Self-pay | Admitting: Emergency Medicine

## 2024-01-08 ENCOUNTER — Emergency Department
Admission: EM | Admit: 2024-01-08 | Discharge: 2024-01-08 | Disposition: A | Discharge status: Home / Self Care | Attending: Emergency Medicine | Admitting: Emergency Medicine

## 2024-01-08 ENCOUNTER — Other Ambulatory Visit: Payer: Self-pay

## 2024-01-08 DIAGNOSIS — M549 Dorsalgia, unspecified: Secondary | ICD-10-CM | POA: Diagnosis present

## 2024-01-08 MED ORDER — METHOCARBAMOL 500 MG PO TABS: 1000.0000 mg | ORAL_TABLET | Freq: Three times a day (TID) | ORAL | 0 refills | Status: AC

## 2024-01-08 MED ORDER — LIDOCAINE 5 % EX PTCH: 1.0000 | MEDICATED_PATCH | CUTANEOUS | 0 refills | Status: AC

## 2024-01-08 MED ORDER — KETOROLAC TROMETHAMINE 15 MG/ML IJ SOLN
15.0000 mg | Freq: Once | INTRAMUSCULAR | Status: AC
  Administered 2024-01-08: 15 mg via INTRAMUSCULAR
  Filled 2024-01-08: qty 1

## 2024-01-08 MED ORDER — LIDOCAINE 5 % EX PTCH
1.0000 | MEDICATED_PATCH | CUTANEOUS | Status: DC
  Administered 2024-01-08: 1 via TRANSDERMAL
  Filled 2024-01-08: qty 1

## 2024-01-08 MED ORDER — METHOCARBAMOL 500 MG PO TABS
1000.0000 mg | ORAL_TABLET | Freq: Once | ORAL | Status: AC
  Administered 2024-01-08: 1000 mg via ORAL
  Filled 2024-01-08: qty 2

## 2024-01-08 NOTE — Discharge Instructions (Addendum)
 I believe the pain in your back is due to a muscle spasm or strain.   You can take 650 mg of Tylenol and 600 mg of ibuprofen every 6 hours as needed for pain. You can use ice, heat, muscle creams and other topical pain relievers as well.  I have sent a muscle relaxer to your pharmacy. This can be taken every 8 hours as needed for muscle spasms. This medication is sedating, so do not drive for 8 hours after taking it.   I have sent lidocaine  patches to the pharmacy, these can be worn for 12 hours at time.   If you have back pain, most people start to feel better within a few weeks. Staying active and returning to your normal activities, even if you have pain, helps you recover faster. Avoid staying in bed for long periods, as this can slow your recovery.   Return to the ED or contact your doctor right away if you experience any of the following: New weakness, numbness or tingling in your legs or feet Loss of control of your bladder or bowels (trouble peeing or pooping, or accidents) Severe pain that does not improve or gets much worse Fever, chills or unexplained weight loss Pain after a fall, injury or trauma Difficulty walking or standing  Try to keep moving and do your usual activities is much as you can.  If your pain does not improve after a few weeks, or if you have questions or concerns, please follow-up with your doctor.  Remember a small increase in pain when you move it is normal and does not mean you are causing harm.  Staying active is important for your recovery.

## 2024-01-08 NOTE — ED Triage Notes (Signed)
 Pt reports waking up yesterday morning with neck pain. Pt says today it is going all the way down his back and up into his head giving him a headache. Pt denies parasthesias.

## 2024-01-08 NOTE — ED Provider Notes (Signed)
 [facility] Provider Note    Event Date/Time   First MD Initiated Contact with Patient 01/08/24 1128     (approximate)   History   Back Pain   HPI  DALLAS TOROK is a 58 y.o. male with PMH of migraines who presents for evaluation of neck pain that radiates down to his back. Patient states symptoms began yesterday morning with neck stiffness and it has progressed down his back and is also causing a headache. Patient states that the neck pain is what triggered his migraine. He denies fevers, numbness/weakness in the extremities and loss of bladder and bowel control. Patient took some of a ubrelvy sample he has for his migraine.       Physical Exam   Triage Vital Signs: ED Triage Vitals  Encounter Vitals Group     BP 01/08/24 1114 121/84     Girls Systolic BP Percentile --      Girls Diastolic BP Percentile --      Boys Systolic BP Percentile --      Boys Diastolic BP Percentile --      Pulse Rate 01/08/24 1114 (!) 104     Resp 01/08/24 1114 19     Temp 01/08/24 1114 98.2 F (36.8 C)     Temp Source 01/08/24 1114 Oral     SpO2 01/08/24 1114 100 %     Weight 01/08/24 1115 157 lb (71.2 kg)     Height 01/08/24 1115 6' 3 (1.905 m)     Head Circumference --      Peak Flow --      Pain Score 01/08/24 1115 4     Pain Loc --      Pain Education --      Exclude from Growth Chart --     Most recent vital signs: Vitals:   01/08/24 1114  BP: 121/84  Pulse: (!) 104  Resp: 19  Temp: 98.2 F (36.8 C)  SpO2: 100%   General: Awake, no distress.  CV:  Good peripheral perfusion. RRR. Resp:  Normal effort. CTAB. Abd:  No distention.  Other:  No point tenderness over the spine, TTP over cervical muscles, sensation in upper and lower extremities is maintained, strength is equal in extremities, radial pulse and dorsalis pedis pulse is 2+ and regular bilaterally, no focal neuro deficits   ED Results / Procedures / Treatments   Labs (all labs ordered  are listed, but only abnormal results are displayed) Labs Reviewed - No data to display    PROCEDURES:  Critical Care performed: No  Procedures   MEDICATIONS ORDERED IN ED: Medications  ketorolac  (TORADOL ) 15 MG/ML injection 15 mg (has no administration in time range)  lidocaine  (LIDODERM ) 5 % 1 patch (has no administration in time range)  methocarbamol (ROBAXIN) tablet 1,000 mg (has no administration in time range)     IMPRESSION / MDM / ASSESSMENT AND PLAN / ED COURSE  I reviewed the triage vital signs and the nursing notes.                             59 year old male presents for evaluation of back pain. Patient was tachycardic on presentation but does appear uncomfortable, VSS otherwise.   Differential diagnosis includes, but is not limited to, muscle strain, muscle spasm, less likely fracture, acute spinal cord injury.   Patient's presentation is most consistent with acute, uncomplicated illness.  Physical exam  is reassuring. Considered xray but patient does not have bony tenderness or a history of direct trauma so felt this was not indicated. Patient does not have objective weakness or sensation loss on exam, nor does he present with red flag symptoms for back pain so do not feel emergent MRI is indicated either. His migraine was similar to previous migraines so CT head was not obtained. I suspect his back pain is due to a muscle strain or spasm. Will treat with multi-modal pain management including topical pain relief using lidocaine  patch, anti-inflammatories, tylenol and muscle relaxer. Will give patient follow up options for PCP and information for neurology for further work up of his migraines.   Patient voiced understanding, all questions were answered and he was stable at discharge.       FINAL CLINICAL IMPRESSION(S) / ED DIAGNOSES   Final diagnoses:  Acute bilateral back pain, unspecified back location     Rx / DC Orders   ED Discharge Orders     None         Note:  This document was prepared using Dragon voice recognition software and may include unintentional dictation errors.   Cleaster Tinnie LABOR, PA-C 01/08/24 1234    Levander Slate, MD 01/08/24 1453
